# Patient Record
Sex: Male | Born: 1995 | Race: White | Hispanic: No | Marital: Single | Smoking: Never smoker
Health system: Southern US, Community
[De-identification: ages and names within clinical notes are randomized; demographics above are authoritative.]

## PROBLEM LIST (undated history)

## (undated) DIAGNOSIS — I729 Aneurysm of unspecified site: Secondary | ICD-10-CM

## (undated) DIAGNOSIS — Z9889 Other specified postprocedural states: Secondary | ICD-10-CM

## (undated) DIAGNOSIS — Q8789 Other specified congenital malformation syndromes, not elsewhere classified: Secondary | ICD-10-CM

## (undated) DIAGNOSIS — Z8679 Personal history of other diseases of the circulatory system: Secondary | ICD-10-CM

## (undated) DIAGNOSIS — Z952 Presence of prosthetic heart valve: Secondary | ICD-10-CM

## (undated) HISTORY — PX: VALVE REPLACEMENT: SUR13

---
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
## 2018-02-26 ENCOUNTER — Encounter (HOSPITAL_COMMUNITY): Payer: Self-pay | Admitting: Emergency Medicine

## 2018-02-26 ENCOUNTER — Ambulatory Visit (HOSPITAL_COMMUNITY)
Admission: EM | Admit: 2018-02-26 | Discharge: 2018-02-26 | Disposition: A | Discharge status: Home / Self Care | Payer: Medicaid Other

## 2018-02-26 DIAGNOSIS — M25562 Pain in left knee: Secondary | ICD-10-CM | POA: Diagnosis not present

## 2018-02-26 DIAGNOSIS — J3089 Other allergic rhinitis: Secondary | ICD-10-CM

## 2018-02-26 DIAGNOSIS — R0981 Nasal congestion: Secondary | ICD-10-CM

## 2018-02-26 DIAGNOSIS — Q8789 Other specified congenital malformation syndromes, not elsewhere classified: Secondary | ICD-10-CM

## 2018-02-26 DIAGNOSIS — M25561 Pain in right knee: Secondary | ICD-10-CM | POA: Diagnosis not present

## 2018-02-26 HISTORY — DX: Personal history of other diseases of the circulatory system: Z86.79

## 2018-02-26 HISTORY — DX: Aneurysm of unspecified site: I72.9

## 2018-02-26 HISTORY — DX: Other specified congenital malformation syndromes, not elsewhere classified: Q87.89

## 2018-02-26 HISTORY — DX: Other specified postprocedural states: Z98.890

## 2018-02-26 HISTORY — DX: Presence of prosthetic heart valve: Z95.2

## 2018-02-26 MED ORDER — PREDNISONE 20 MG PO TABS: ORAL_TABLET | ORAL | 0 refills | Status: AC

## 2018-02-26 NOTE — ED Provider Notes (Signed)
MRN: [id number] DOB: 1995-07-21  Subjective:   Nicholas Salinas is a 23 y.o. male presenting for 1-2 week history of bilateral sharp, constant knee pain. Symptoms started while patient was walking to school, has to walk ~1 mile round trip. Has tried ibuprofen, Aleve every day. Has a cardiologist, was advised not to use this.  He is also had some intermittent mild nasal congestion, sinus drainage.  Takes oral antihistamine every day.  No current facility-administered medications for this encounter.   Current Outpatient Medications:  .  aspirin EC 81 MG tablet, Take 81 mg by mouth daily., Disp: , Rfl:  .  cetirizine (ZYRTEC) 10 MG tablet, Take 10 mg by mouth daily., Disp: , Rfl:  .  gabapentin (NEURONTIN) 100 MG capsule, Take 100 mg by mouth 3 (three) times daily., Disp: , Rfl:  .  losartan (COZAAR) 50 MG tablet, Take 50 mg by mouth daily., Disp: , Rfl:  .  metoprolol succinate (TOPROL-XL) 50 MG 24 hr tablet, Take 50 mg by mouth daily. Take with or immediately following a meal., Disp: , Rfl:    No Known Allergies  Past Medical History:  Diagnosis Date  . Aneurysm (HCC)   . Aortic valve replaced   . H/O aortic valve repair   . Loeys-Dietz syndrome     Past Surgical History:  Procedure Laterality Date  . VALVE REPLACEMENT     Review of Systems  Constitutional: Negative for chills, fever and malaise/fatigue.  HENT: Negative for ear pain, sinus pain and sore throat.   Eyes: Negative for blurred vision and double vision.  Respiratory: Negative for cough, shortness of breath and wheezing.   Cardiovascular: Negative for chest pain.  Gastrointestinal: Negative for abdominal pain, blood in stool, nausea and vomiting.  Genitourinary: Negative for dysuria and hematuria.  Musculoskeletal: Negative for myalgias.  Skin: Negative for itching and rash.  Neurological: Negative for dizziness and headaches.  Psychiatric/Behavioral: Negative for substance abuse.    Objective:   Vitals: BP  135/74 (BP Location: Right Arm)   Pulse 78   Temp 98.3 F (36.8 C) (Temporal)   Resp 18   SpO2 100%   Physical Exam Constitutional:      General: He is not in acute distress.    Appearance: Normal appearance. He is well-developed and normal weight. He is not ill-appearing, toxic-appearing or diaphoretic.  HENT:     Head: Normocephalic and atraumatic.     Right Ear: Tympanic membrane, ear canal and external ear normal. There is no impacted cerumen.     Left Ear: Tympanic membrane, ear canal and external ear normal. There is no impacted cerumen.     Nose: Nose normal. No congestion or rhinorrhea.     Mouth/Throat:     Mouth: Mucous membranes are moist.     Pharynx: Oropharynx is clear. No oropharyngeal exudate or posterior oropharyngeal erythema.  Eyes:     General: No scleral icterus.       Right eye: No discharge.        Left eye: No discharge.     Extraocular Movements: Extraocular movements intact.     Conjunctiva/sclera: Conjunctivae normal.     Pupils: Pupils are equal, round, and reactive to light.  Neck:     Musculoskeletal: Normal range of motion and neck supple. No neck rigidity or muscular tenderness.  Cardiovascular:     Rate and Rhythm: Normal rate and regular rhythm.     Heart sounds: Murmur present. No friction rub. No gallop.  Pulmonary:     Effort: Pulmonary effort is normal. No respiratory distress.     Breath sounds: Normal breath sounds. No stridor. No wheezing, rhonchi or rales.  Musculoskeletal:     Right knee: He exhibits abnormal patellar mobility (mild crepitus). He exhibits normal range of motion, no swelling, no effusion, no ecchymosis, no deformity, no erythema, normal alignment and no bony tenderness. No tenderness found. No medial joint line, no lateral joint line, no MCL, no LCL and no patellar tendon tenderness noted.     Left knee: He exhibits swelling (trace) and abnormal patellar mobility (mild crepitus). He exhibits normal range of motion, no  ecchymosis, no deformity, no laceration, no erythema, normal alignment and no bony tenderness. No tenderness found. No medial joint line, no lateral joint line, no MCL, no LCL and no patellar tendon tenderness noted.  Neurological:     General: No focal deficit present.     Mental Status: He is alert and oriented to person, place, and time.  Psychiatric:        Mood and Affect: Mood normal.        Behavior: Behavior normal.        Thought Content: Thought content normal.    Assessment and Plan :   Acute bilateral knee pain  Loeys-Dietz syndrome  Sinus congestion  Allergic rhinitis due to other allergic trigger, unspecified seasonality  Patient is agreeable to use prednisone course, stop ibuprofen and Aleve.  Maintain oral antihistamine.  Follow-up with PCP and cardiology. Counseled patient on potential for adverse effects with medications prescribed today, patient verbalized understanding. ER and return-to-clinic precautions discussed, patient verbalized understanding.     Wallis Bamberg, [place] 02/26/18 1931

## 2018-02-26 NOTE — ED Triage Notes (Signed)
Pt here for bilateral knee pain; pt denies injury; pt sts sinus congestion

## 2019-05-22 ENCOUNTER — Emergency Department: Payer: Medicaid Other

## 2019-05-22 ENCOUNTER — Emergency Department
Admission: EM | Admit: 2019-05-22 | Discharge: 2019-05-22 | Disposition: A | Discharge status: Home / Self Care | Payer: Medicaid Other | Attending: Emergency Medicine | Admitting: Emergency Medicine

## 2019-05-22 ENCOUNTER — Other Ambulatory Visit: Payer: Self-pay

## 2019-05-22 DIAGNOSIS — Z5321 Procedure and treatment not carried out due to patient leaving prior to being seen by health care provider: Secondary | ICD-10-CM | POA: Insufficient documentation

## 2019-05-22 DIAGNOSIS — R079 Chest pain, unspecified: Secondary | ICD-10-CM | POA: Insufficient documentation

## 2019-05-22 LAB — BASIC METABOLIC PANEL
Anion gap: 6 (ref 5–15)
BUN: 13 mg/dL (ref 6–20)
CO2: 27 mmol/L (ref 22–32)
Calcium: 9.3 mg/dL (ref 8.9–10.3)
Chloride: 107 mmol/L (ref 98–111)
Creatinine, Ser: 0.76 mg/dL (ref 0.61–1.24)
GFR calc Af Amer: >60 mL/min (ref >60)
GFR calc non Af Amer: >60 mL/min (ref >60)
Glucose, Bld: 89 mg/dL (ref 70–99)
Potassium: 4 mmol/L (ref 3.5–5.1)
Sodium: 140 mmol/L (ref 135–145)

## 2019-05-22 LAB — CBC
HCT: 35.4 % — ABNORMAL LOW (ref 39.0–52.0)
Hemoglobin: 12.2 g/dL — ABNORMAL LOW (ref 13.0–17.0)
MCH: 31.5 pg (ref 26.0–34.0)
MCHC: 34.5 g/dL (ref 30.0–36.0)
MCV: 91.5 fL (ref 80.0–100.0)
Platelets: 149 10*3/uL — ABNORMAL LOW (ref 150–400)
RBC: 3.87 MIL/uL — ABNORMAL LOW (ref 4.22–5.81)
RDW: 12.5 % (ref 11.5–15.5)
WBC: 7.4 10*3/uL (ref 4.0–10.5)
nRBC: 0 % (ref 0.0–0.2)

## 2019-05-22 LAB — TROPONIN I (HIGH SENSITIVITY): Troponin I (High Sensitivity): 8 ng/L (ref <18)

## 2019-05-22 LAB — BRAIN NATRIURETIC PEPTIDE: B Natriuretic Peptide: 74 pg/mL (ref 0.0–100.0)

## 2019-05-22 MED ORDER — SODIUM CHLORIDE 0.9% FLUSH: 3.0000 mL | Freq: Once | INTRAVENOUS | Status: DC

## 2019-05-22 NOTE — ED Triage Notes (Addendum)
Pt comes via POV from home with c/o chest pain that has been going on for 3 days. Pt states hx of aortic valve replacement 1 year ago.  Pt states left sided chest pain.  Pt denies any SOB, back pain or radiation to arm or neck. Pt denies any N/V/D

## 2019-05-22 NOTE — ED Notes (Signed)
Pt not visualized in ED at this time or sitting outside. Pt called for room x 3 without answer.

## 2019-05-23 ENCOUNTER — Telehealth: Payer: Self-pay | Admitting: Emergency Medicine

## 2019-05-23 NOTE — Telephone Encounter (Addendum)
Called patient due to lwot to inquire about condition and follow up plans. He answered, but we had a bad connection.  I tried to ask how he is doing and he couldn't understand and then we got cut off.  He called me back and I encouraged himt o call his doctor and let them know his symptoms and told him labs are available for his doctor.

## 2019-12-30 ENCOUNTER — Encounter (HOSPITAL_COMMUNITY): Payer: Self-pay | Admitting: Emergency Medicine

## 2019-12-30 ENCOUNTER — Emergency Department (HOSPITAL_COMMUNITY)
Admission: EM | Admit: 2019-12-30 | Discharge: 2019-12-30 | Disposition: A | Discharge status: Home / Self Care | Payer: Medicaid Other | Attending: Emergency Medicine | Admitting: Emergency Medicine

## 2019-12-30 DIAGNOSIS — Z5321 Procedure and treatment not carried out due to patient leaving prior to being seen by health care provider: Secondary | ICD-10-CM | POA: Diagnosis not present

## 2019-12-30 DIAGNOSIS — R519 Headache, unspecified: Secondary | ICD-10-CM | POA: Insufficient documentation

## 2019-12-30 LAB — COMPREHENSIVE METABOLIC PANEL
ALT: 16 U/L (ref 0–44)
AST: 17 U/L (ref 15–41)
Albumin: 4.1 g/dL (ref 3.5–5.0)
Alkaline Phosphatase: 56 U/L (ref 38–126)
Anion gap: 9 (ref 5–15)
BUN: 8 mg/dL (ref 6–20)
CO2: 24 mmol/L (ref 22–32)
Calcium: 9.4 mg/dL (ref 8.9–10.3)
Chloride: 101 mmol/L (ref 98–111)
Creatinine, Ser: 0.98 mg/dL (ref 0.61–1.24)
GFR, Estimated: >60 mL/min (ref >60)
Glucose, Bld: 125 mg/dL — ABNORMAL HIGH (ref 70–99)
Potassium: 4.4 mmol/L (ref 3.5–5.1)
Sodium: 134 mmol/L — ABNORMAL LOW (ref 135–145)
Total Bilirubin: 1.3 mg/dL — ABNORMAL HIGH (ref 0.3–1.2)
Total Protein: 6.9 g/dL (ref 6.5–8.1)

## 2019-12-30 LAB — CBC
HCT: 36.2 % — ABNORMAL LOW (ref 39.0–52.0)
Hemoglobin: 12.7 g/dL — ABNORMAL LOW (ref 13.0–17.0)
MCH: 31.4 pg (ref 26.0–34.0)
MCHC: 35.1 g/dL (ref 30.0–36.0)
MCV: 89.6 fL (ref 80.0–100.0)
Platelets: 227 10*3/uL (ref 150–400)
RBC: 4.04 MIL/uL — ABNORMAL LOW (ref 4.22–5.81)
RDW: 11.8 % (ref 11.5–15.5)
WBC: 11.9 10*3/uL — ABNORMAL HIGH (ref 4.0–10.5)
nRBC: 0 % (ref 0.0–0.2)

## 2019-12-30 NOTE — ED Triage Notes (Signed)
Pt here from home with c/o headache and not feeling well , pt taking otc meds without relief

## 2019-12-30 NOTE — ED Notes (Signed)
Pt refused covid swab. °
